# Patient Record
Sex: Male | Born: 1990 | Race: White | Hispanic: No | Marital: Single | Smoking: Never smoker
Health system: Southern US, Community
[De-identification: ages and names within clinical notes are randomized; demographics above are authoritative.]

## PROBLEM LIST (undated history)

## (undated) HISTORY — PX: WISDOM TOOTH EXTRACTION: SHX21

---
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
## 2011-10-18 ENCOUNTER — Other Ambulatory Visit (HOSPITAL_COMMUNITY): Payer: Self-pay | Admitting: Physician Assistant

## 2011-10-18 ENCOUNTER — Ambulatory Visit (HOSPITAL_COMMUNITY)
Admission: RE | Admit: 2011-10-18 | Discharge: 2011-10-18 | Disposition: A | Discharge status: Home / Self Care | Payer: Medicaid Other | Source: Ambulatory Visit | Attending: Physician Assistant | Admitting: Physician Assistant

## 2011-10-18 DIAGNOSIS — R05 Cough: Secondary | ICD-10-CM

## 2011-10-18 DIAGNOSIS — R059 Cough, unspecified: Secondary | ICD-10-CM | POA: Insufficient documentation

## 2016-09-02 ENCOUNTER — Ambulatory Visit (INDEPENDENT_AMBULATORY_CARE_PROVIDER_SITE_OTHER): Payer: BC Managed Care – PPO

## 2016-09-02 DIAGNOSIS — Z23 Encounter for immunization: Secondary | ICD-10-CM | POA: Diagnosis not present

## 2016-09-11 ENCOUNTER — Emergency Department (HOSPITAL_COMMUNITY): Payer: BC Managed Care – PPO

## 2016-09-11 ENCOUNTER — Emergency Department (HOSPITAL_COMMUNITY)
Admission: EM | Admit: 2016-09-11 | Discharge: 2016-09-11 | Disposition: A | Discharge status: Home / Self Care | Payer: BC Managed Care – PPO | Attending: Emergency Medicine | Admitting: Emergency Medicine

## 2016-09-11 ENCOUNTER — Encounter (HOSPITAL_COMMUNITY): Payer: Self-pay | Admitting: Emergency Medicine

## 2016-09-11 DIAGNOSIS — R61 Generalized hyperhidrosis: Secondary | ICD-10-CM | POA: Insufficient documentation

## 2016-09-11 DIAGNOSIS — R1031 Right lower quadrant pain: Secondary | ICD-10-CM

## 2016-09-11 DIAGNOSIS — R200 Anesthesia of skin: Secondary | ICD-10-CM | POA: Insufficient documentation

## 2016-09-11 DIAGNOSIS — R531 Weakness: Secondary | ICD-10-CM | POA: Insufficient documentation

## 2016-09-11 DIAGNOSIS — R110 Nausea: Secondary | ICD-10-CM | POA: Diagnosis not present

## 2016-09-11 DIAGNOSIS — R42 Dizziness and giddiness: Secondary | ICD-10-CM | POA: Insufficient documentation

## 2016-09-11 LAB — CBC
HCT: 44.4 % (ref 39.0–52.0)
Hemoglobin: 15.5 g/dL (ref 13.0–17.0)
MCH: 31.3 pg (ref 26.0–34.0)
MCHC: 34.9 g/dL (ref 30.0–36.0)
MCV: 89.7 fL (ref 78.0–100.0)
PLATELETS: 299 10*3/uL (ref 150–400)
RBC: 4.95 MIL/uL (ref 4.22–5.81)
RDW: 11.7 % (ref 11.5–15.5)
WBC: 9.7 10*3/uL (ref 4.0–10.5)

## 2016-09-11 LAB — URINALYSIS, ROUTINE W REFLEX MICROSCOPIC
BILIRUBIN URINE: NEGATIVE
GLUCOSE, UA: NEGATIVE mg/dL
HGB URINE DIPSTICK: NEGATIVE
KETONES UR: NEGATIVE mg/dL
Leukocytes, UA: NEGATIVE
Nitrite: NEGATIVE
PROTEIN: NEGATIVE mg/dL
Specific Gravity, Urine: >1.03 — ABNORMAL HIGH (ref 1.005–1.030)
pH: 6 (ref 5.0–8.0)

## 2016-09-11 LAB — COMPREHENSIVE METABOLIC PANEL
ALT: 55 U/L (ref 17–63)
AST: 36 U/L (ref 15–41)
Albumin: 4.3 g/dL (ref 3.5–5.0)
Alkaline Phosphatase: 52 U/L (ref 38–126)
Anion gap: 6 (ref 5–15)
BUN: 12 mg/dL (ref 6–20)
CHLORIDE: 104 mmol/L (ref 101–111)
CO2: 27 mmol/L (ref 22–32)
CREATININE: 1.16 mg/dL (ref 0.61–1.24)
Calcium: 9.4 mg/dL (ref 8.9–10.3)
GFR calc Af Amer: >60 mL/min (ref >60)
GLUCOSE: 115 mg/dL — AB (ref 65–99)
Potassium: 4.1 mmol/L (ref 3.5–5.1)
Sodium: 137 mmol/L (ref 135–145)
Total Bilirubin: 1.9 mg/dL — ABNORMAL HIGH (ref 0.3–1.2)
Total Protein: 7.3 g/dL (ref 6.5–8.1)

## 2016-09-11 LAB — LIPASE, BLOOD: LIPASE: 25 U/L (ref 11–51)

## 2016-09-11 MED ORDER — POLYETHYLENE GLYCOL 3350 17 G PO PACK: 17.0000 g | PACK | Freq: Every day | ORAL | 0 refills | Status: DC

## 2016-09-11 NOTE — ED Provider Notes (Signed)
[facility]-EM[facility]ENCY DEPT Provider Note   CSN: [id number] Arrival date & time: 09/11/16  1227     History   Chief Complaint Chief Complaint  Patient presents with  . Abdominal Pain    HPI Derrick Espinoza is a 25 year old man with no significant previous medical problems.  HPI  He presents with abdominal pain. He woke up this morning around 12pm. He thinks the pain woke him from his sleep. It is located in the right lower quadrant. Sharp in nature. Spread to the entire abdomen. Associated diaphoresis, lightheadedness, nauseated, generalized weakness, generalized numbness. No emesis. Lasted about 10 minutes. Went away and then returned. Presently improved. Never had pain like this before. No heartburn or acid reflux. Only abdominal surgery was umbilical hernia repair as a child.   Last bowel movement during this episode. Stool was formed. No hematochezia. No melena. No post prandial pain.   No fevers or chills.    History reviewed. No pertinent past medical history.   History reviewed. No pertinent surgical history.    Home Medications    Prior to Admission medications   Medication Sig Start Date End Date Taking? Authorizing Provider  polyethylene glycol (MIRALAX / GLYCOLAX) packet Take 17 g by mouth daily. 09/11/16   Lora PaulaJennifer T Ylianna Almanzar, MD    Family History History reviewed. No pertinent family history.  Social History Social History  Substance Use Topics  . Smoking status: Never Smoker  . Smokeless tobacco: Not on file  . Alcohol use Yes     Comment: occ     Allergies   Review of patient's allergies indicates no known allergies.   Review of Systems Review of Systems Constitutional: no fevers/chills Eyes: no vision changes Ears, nose, mouth, throat, and face: no cough Respiratory: no shortness of breath Cardiovascular: no chest pain Gastrointestinal: +nausea, no vomiting, +abdominal pain, no constipation, no diarrhea Genitourinary: no dysuria, no  hematuria Integument: no rash Hematologic/lymphatic: no bleeding/bruising, no edema Musculoskeletal: no arthralgias, no myalgias Neurological: no paresthesias, no focal weakness   Physical Exam Updated Vital Signs BP 121/80   Pulse 86   Temp 97.7 F (36.5 C) (Oral)   Resp 15   Ht 6\' 2"  (1.88 m)   Wt 104.3 kg   SpO2 98%   BMI 29.53 kg/m   Physical Exam General Apperance: NAD Head: Normocephalic, atraumatic Eyes: PERRL, EOMI, anicteric sclera Ears: Normal external ear canal Nose: Nares normal, septum midline, mucosa normal Throat: Lips, mucosa and tongue normal  Neck: Supple, trachea midline Back: No tenderness or bony abnormality  Lungs: Clear to auscultation bilaterally. No wheezes, rhonchi or rales. Breathing comfortably Chest Wall: Nontender, no deformity Heart: Regular rate and rhythm, no murmur/rub/gallop Abdomen: Soft, mildly tender to palpation in RLQ, nondistended, no rebound/guarding Extremities: Normal, atraumatic, warm and well perfused, no edema Pulses: 2+ throughout Skin: No rashes or lesions Neurologic: Alert and oriented x 3. CNII-XII intact. Normal strength and sensation  ED Treatments / Results  Labs (all labs ordered are listed, but only abnormal results are displayed) Labs Reviewed  COMPREHENSIVE METABOLIC PANEL - Abnormal; Notable for the following:       Result Value   Glucose, Bld 115 (*)    Total Bilirubin 1.9 (*)    All other components within normal limits  URINALYSIS, ROUTINE W REFLEX MICROSCOPIC (NOT AT [facility]) - Abnormal; Notable for the following:    Specific Gravity, Urine >1.030 (*)    All other components within normal limits  LIPASE, BLOOD  CBC  Radiology Dg Abdomen 1 View  Result Date: 09/11/2016 CLINICAL DATA:  Lambert Mody right lower quadrant pain. EXAM: ABDOMEN - 1 VIEW COMPARISON:  None. FINDINGS: The bowel gas pattern is normal. No abnormal stool retention. No radio-opaque calculi or other significant radiographic abnormality  are seen. IMPRESSION: Negative. Electronically Signed   By: Marnee Spring M.D.   On: 09/11/2016 15:26    Procedures   Medications Ordered in ED Medications - No data to display   Initial Impression / Assessment and Plan / ED Course  I have reviewed the triage vital signs and the nursing notes.  Pertinent labs & imaging results that were available during my care of the patient were reviewed by me and considered in my medical decision making (see chart for details).  Clinical Course   25 year old man with no significant previous medical problems presenting with acute RLQ abdominal pain today.  Final Clinical Impressions(s) / ED Diagnoses   Final diagnoses:  Right lower quadrant abdominal pain  Afebrile and no leukocytosis. CBC unremarkable. Isolated T bili elevation of 1.9 otherwise CMP unremarkable. UA negative with no hgb. Negative Abd XR. Low suspicion for appendicitis given exam and no peritoneal signs. Discussed return precautions with patient. Miralax daily for constipation. Follow up with PCP.  New Prescriptions Discharge Medication List as of 09/11/2016  3:36 PM    START taking these medications   Details  polyethylene glycol (MIRALAX / GLYCOLAX) packet Take 17 g by mouth daily., Starting Mon 09/11/2016, Print         Lora Paula, MD 09/11/16 1552    Nelva Nay, MD 09/16/16 2440

## 2016-09-11 NOTE — Discharge Instructions (Signed)
Please make an appointment to follow up with your primary care provider. Take Miralax daily to help with constipation. Stop taking if your stools become too loose or frequent.

## 2016-09-11 NOTE — ED Notes (Signed)
MD at bedside. 

## 2016-09-11 NOTE — ED Notes (Signed)
Patient transported to X-ray 

## 2016-09-11 NOTE — ED Triage Notes (Signed)
Pt reports lower abd pain x20 minutes.  Pt denies v/d, but does have nausea. Pt alert and oriented at this time. Denies urinary symptoms.

## 2017-01-14 IMAGING — DX DG ABDOMEN 1V
2 series · 2 of 2 positions shown · non-contrast
Comparison: None.

CLINICAL DATA: Sharp right lower quadrant pain.

EXAM:
ABDOMEN - 1 VIEW

[abdomen kub (1 of 2)]
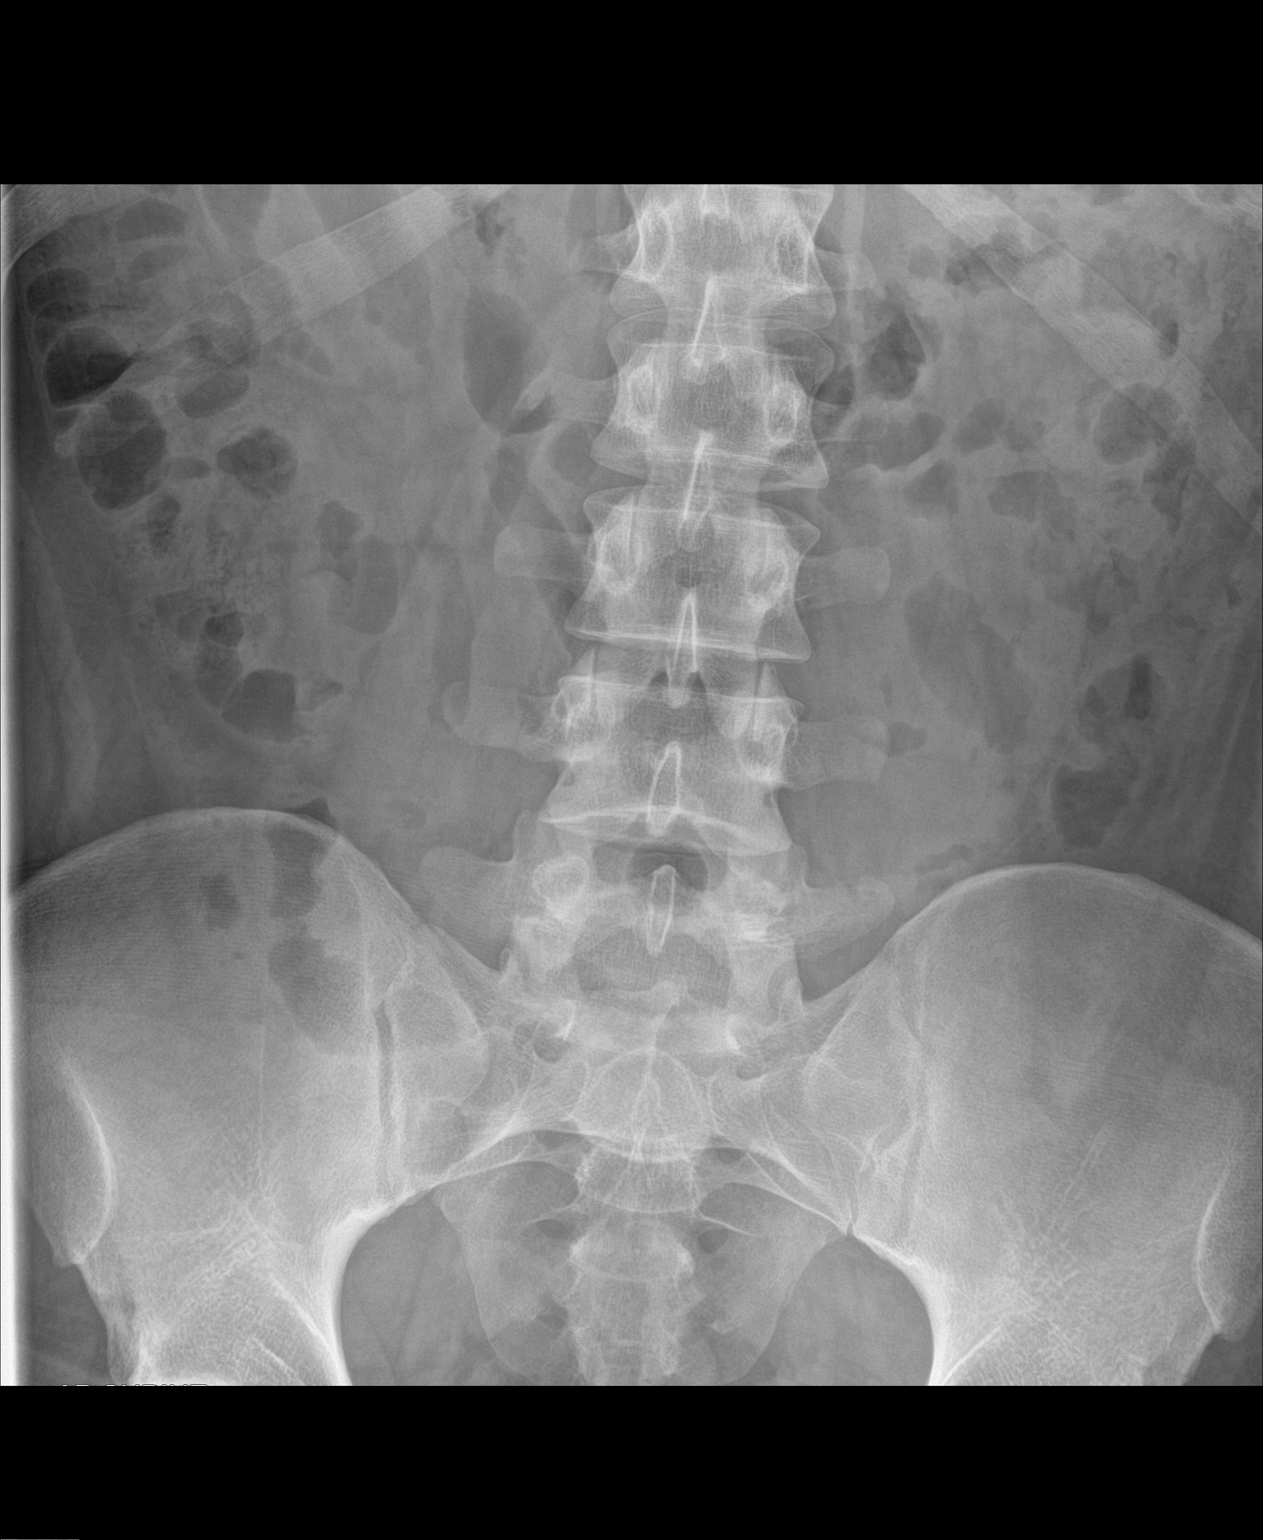

[abdomen kub (2 of 2)]
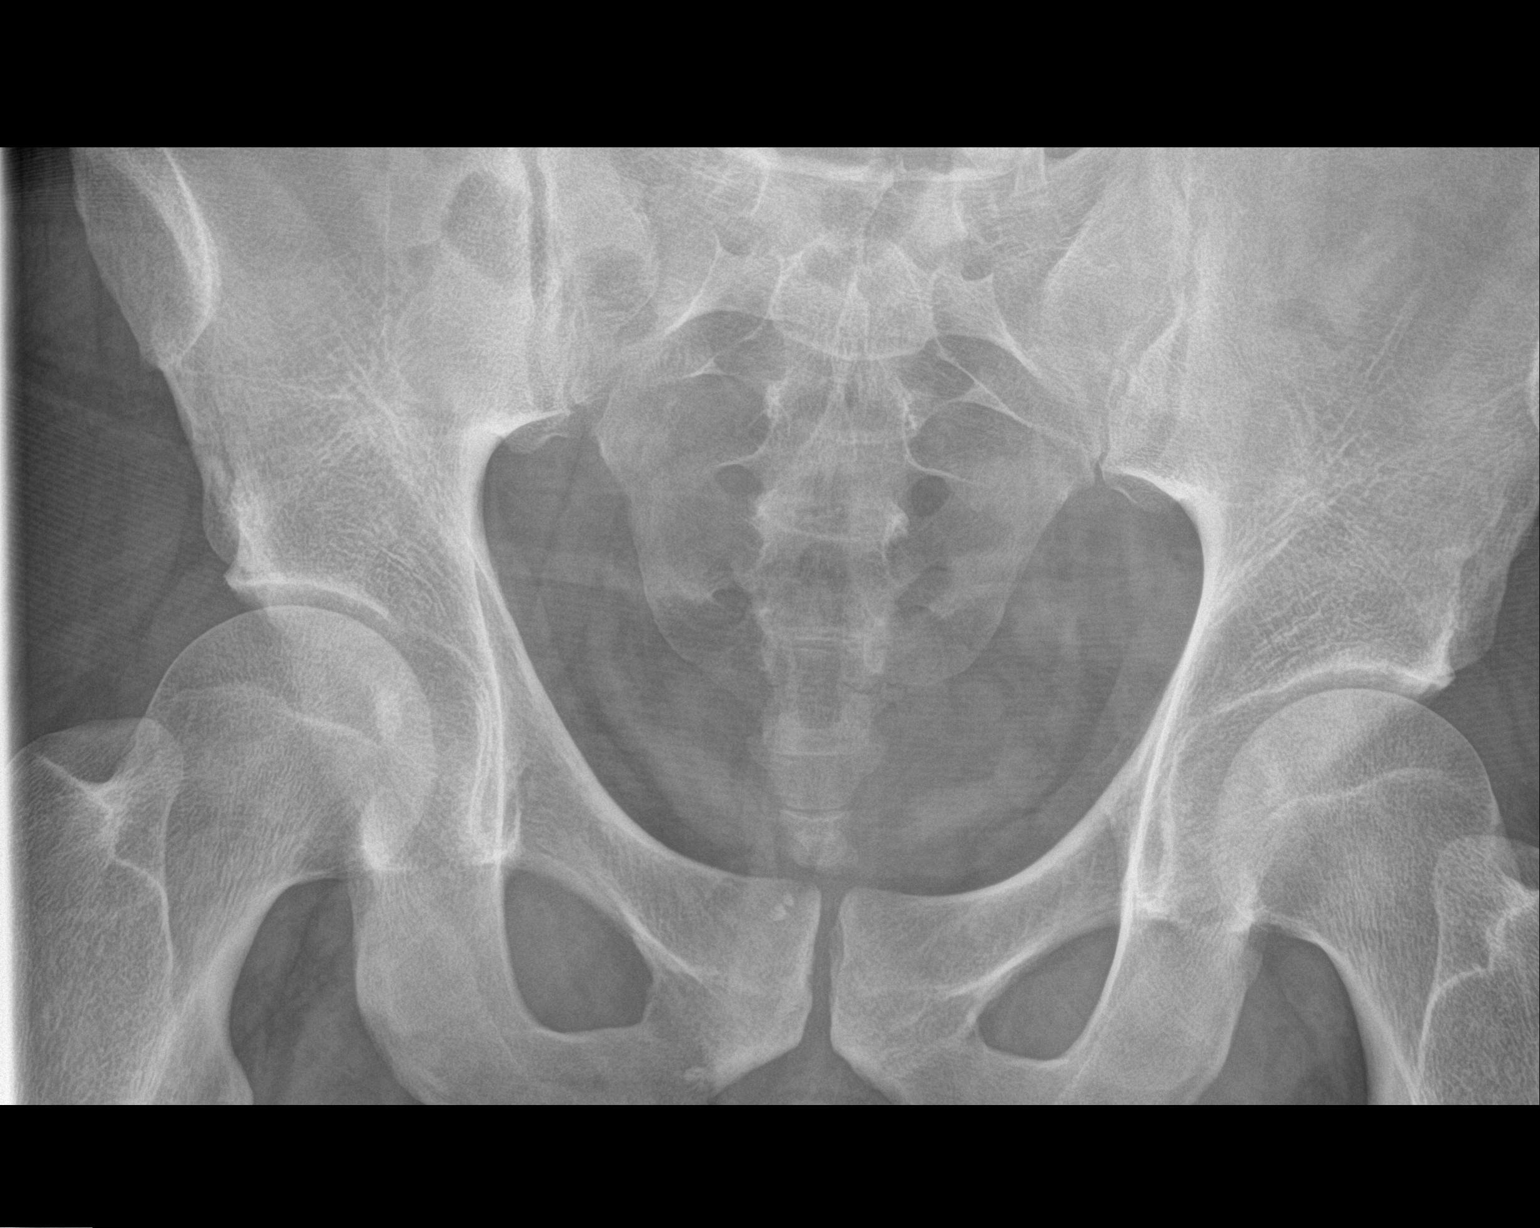

[2 of 2 positions shown; findings below may reference images not displayed]

FINDINGS: The bowel gas pattern is normal. No abnormal stool retention. No
radio-opaque calculi or other significant radiographic abnormality
are seen.
IMPRESSION: Negative.

## 2018-09-05 ENCOUNTER — Ambulatory Visit (INDEPENDENT_AMBULATORY_CARE_PROVIDER_SITE_OTHER): Payer: BC Managed Care – PPO | Admitting: Internal Medicine

## 2018-09-05 ENCOUNTER — Encounter (INDEPENDENT_AMBULATORY_CARE_PROVIDER_SITE_OTHER): Payer: Self-pay | Admitting: Internal Medicine

## 2018-09-05 VITALS — BP 150/90 | HR 76 | Temp 98.2°F | Ht 73.0 in | Wt 244.3 lb

## 2018-09-05 DIAGNOSIS — R194 Change in bowel habit: Secondary | ICD-10-CM | POA: Diagnosis not present

## 2018-09-05 DIAGNOSIS — R1030 Lower abdominal pain, unspecified: Secondary | ICD-10-CM | POA: Diagnosis not present

## 2018-09-05 NOTE — Progress Notes (Signed)
   Subjective:    Patient ID: Derrick Espinoza, male    DOB: 1991-11-03, 27 y.o.   MRN: [id number]  HPI Referred by Dr. Sherwood Gambler for abdominal pain. A month ago he was having loose stools. As soon as he ate, he would have a BM. He would have a sharp pain in his lower abdomen and would have to go to the [facility]. Stool was guaiac negative in office.   He is having 2 BM a day now. Symptoms are better.   Some days his stools are loose. He does still have urgency. No mucous.  Appetite is good. Weight loss of 8 pounds since March. No family hx of colon cancer. No abdominal pain today.   Review of Systems History reviewed. No pertinent past medical history.    No Known Allergies        Objective:   Physical Exam Blood pressure (!) 150/90, pulse 76, temperature 98.2 F (36.8 C), height 6\' 1"  (1.854 m), weight 244 lb 4.8 oz (110.8 kg). Alert and oriented. Skin warm and dry. Oral mucosa is moist.   . Sclera anicteric, conjunctivae is pink. Thyroid not enlarged. No cervical lymphadenopathy. Lungs clear. Heart regular rate and rhythm.  Abdomen is soft. Bowel sounds are positive. No hepatomegaly. No abdominal masses felt. No tenderness.  No edema to lower extremities.           Assessment & Plan:  Abdominal pain. Change in stool. Am going to get a CT abdomen/pelvis with CM. Further recommendations to follow.

## 2018-09-05 NOTE — Patient Instructions (Signed)
CT abdomen/pelvis with CM.  

## 2018-09-27 ENCOUNTER — Ambulatory Visit (HOSPITAL_COMMUNITY): Payer: BC Managed Care – PPO

## 2023-02-07 ENCOUNTER — Other Ambulatory Visit (HOSPITAL_COMMUNITY): Payer: Self-pay | Admitting: Internal Medicine

## 2023-02-07 DIAGNOSIS — M545 Low back pain, unspecified: Secondary | ICD-10-CM

## 2023-02-07 DIAGNOSIS — M5416 Radiculopathy, lumbar region: Secondary | ICD-10-CM

## 2023-02-14 ENCOUNTER — Ambulatory Visit (HOSPITAL_COMMUNITY)
Admission: RE | Admit: 2023-02-14 | Discharge: 2023-02-14 | Disposition: A | Discharge status: Home / Self Care | Payer: BC Managed Care – PPO | Source: Ambulatory Visit | Attending: Internal Medicine | Admitting: Internal Medicine

## 2023-02-14 ENCOUNTER — Encounter (HOSPITAL_COMMUNITY): Payer: Self-pay | Admitting: Radiology

## 2023-02-14 DIAGNOSIS — M5416 Radiculopathy, lumbar region: Secondary | ICD-10-CM | POA: Diagnosis present

## 2023-02-14 DIAGNOSIS — M545 Low back pain, unspecified: Secondary | ICD-10-CM | POA: Insufficient documentation
